# Patient Record
Sex: Female | Born: 1990 | Race: Black or African American | Hispanic: No | Marital: Single | Smoking: Never smoker
Health system: Southern US, Community
[De-identification: ages and names within clinical notes are randomized; demographics above are authoritative.]

## PROBLEM LIST (undated history)

## (undated) HISTORY — PX: CARDIAC VALVE SURGERY: SHX40

---
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
## 2013-02-18 LAB — HM PAP SMEAR: HM Pap smear: NORMAL

## 2013-02-20 ENCOUNTER — Other Ambulatory Visit: Payer: Self-pay

## 2013-02-24 ENCOUNTER — Encounter (HOSPITAL_COMMUNITY): Payer: Self-pay | Admitting: Unknown Physician Specialty

## 2013-03-17 ENCOUNTER — Ambulatory Visit (HOSPITAL_COMMUNITY)
Admission: RE | Admit: 2013-03-17 | Discharge: 2013-03-17 | Disposition: A | Discharge status: Home / Self Care | Payer: Medicaid Other | Source: Ambulatory Visit | Attending: Unknown Physician Specialty | Admitting: Unknown Physician Specialty

## 2013-03-17 ENCOUNTER — Encounter (HOSPITAL_COMMUNITY): Payer: Self-pay

## 2013-03-17 VITALS — BP 122/62 | HR 67 | Wt 151.0 lb

## 2013-03-17 DIAGNOSIS — Q210 Ventricular septal defect: Secondary | ICD-10-CM | POA: Insufficient documentation

## 2013-03-17 DIAGNOSIS — Q249 Congenital malformation of heart, unspecified: Secondary | ICD-10-CM | POA: Insufficient documentation

## 2013-03-17 DIAGNOSIS — O99891 Other specified diseases and conditions complicating pregnancy: Secondary | ICD-10-CM | POA: Insufficient documentation

## 2013-03-17 NOTE — Progress Notes (Signed)
MATERNAL FETAL MEDICINE CONSULT  Patient Name: Deborah Kaiser Medical Record Number:  [id number] Date of Birth: Jan 23, 1991 Requesting Physician Name:  Ruthy Dick, MD Date of Service: 03/17/2013  Chief Complaint Maternal history of congenital heart disease  History of Present Illness Deborah Kaiser was seen Kaiser secondary to maternal history of congenital heart disease at the request of Ruthy Dick, MD.  The patient is a 22 y.o. at 18 weeks 2 days by first trimester ultrasound with an EDC of 08/16/12.  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  She has not had any problems since.  Her last echocardiogram and cardiology visit was 3 years ago.  She is asymptomatic at this time.  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  She has had no complications of pregnancy thus far.  Review of Systems Pertinent items are noted in HPI.  Patient History OB History  No data available    Past Medical/Surgical History Congenital VSD VSD repair at 55-66 years of age  History   Social History  . Marital Status: Single    Spouse Name: N/A    Number of Children: N/A  . Years of Education: N/A   Social History Main Topics  . Smoking status: Not on file  . Smokeless tobacco: Not on file  . Alcohol Use: Not on file  . Drug Use: Not on file  . Sexual Activity: Not on file   Other Topics Concern  . Not on file   Social History Narrative  . No narrative on file    Family History In addition, the patient has no family history of mental retardation, birth defects, or genetic diseases.  Physical Examination Vitals:  BP 122/62, Pulse 67, Weight 151 lbs. General appearance - alert, well appearing, and in no distress  Assessment and Recommendations 1.  Maternal history of congenital VSD.  As Deborah Kaiser has had no problems since her repair at 21-69 years of age and the fact that  she maintains the fitness level expected of someone in [employer] reserves, I do not expect her to have any cardiac related problems in pregnancy.  However, she should have a maternal echo an Cardiology consult soon.  If her echo is normal, I expect no further Cardiology follow up will be needed.  There is a 10% risk of congenital heart disease in offspring of women with congenital heart disease themselves.  The recurrent heart lesions are not necessarily the same as those present in the mother.  Thus, Deborah Kaiser should also have a fetal echo, which has been ordered along with a detailed fetal anatomic survey at the [facility] in 4 weeks.  I spent 30 minutes with Deborah Kaiser of which 50% was face-to-face counseling.  Thank you for referring Deborah Kaiser to the [facility].  Please do not hesitate to contact us with questions.   Rema Fendt, MD

## 2013-04-14 ENCOUNTER — Other Ambulatory Visit (HOSPITAL_COMMUNITY): Payer: Self-pay | Admitting: Unknown Physician Specialty

## 2013-04-14 DIAGNOSIS — Z0489 Encounter for examination and observation for other specified reasons: Secondary | ICD-10-CM

## 2013-04-14 DIAGNOSIS — IMO0002 Reserved for concepts with insufficient information to code with codable children: Secondary | ICD-10-CM

## 2013-04-14 DIAGNOSIS — Q249 Congenital malformation of heart, unspecified: Secondary | ICD-10-CM

## 2013-04-16 ENCOUNTER — Ambulatory Visit (HOSPITAL_COMMUNITY)
Admission: RE | Admit: 2013-04-16 | Discharge: 2013-04-16 | Disposition: A | Discharge status: Home / Self Care | Payer: Medicaid Other | Source: Ambulatory Visit | Attending: Unknown Physician Specialty | Admitting: Unknown Physician Specialty

## 2013-04-16 DIAGNOSIS — IMO0002 Reserved for concepts with insufficient information to code with codable children: Secondary | ICD-10-CM

## 2013-04-16 DIAGNOSIS — O352XX0 Maternal care for (suspected) hereditary disease in fetus, not applicable or unspecified: Secondary | ICD-10-CM | POA: Insufficient documentation

## 2013-04-16 DIAGNOSIS — Q249 Congenital malformation of heart, unspecified: Secondary | ICD-10-CM

## 2013-04-16 DIAGNOSIS — O358XX0 Maternal care for other (suspected) fetal abnormality and damage, not applicable or unspecified: Secondary | ICD-10-CM | POA: Insufficient documentation

## 2013-04-16 DIAGNOSIS — Z1389 Encounter for screening for other disorder: Secondary | ICD-10-CM | POA: Insufficient documentation

## 2013-04-16 DIAGNOSIS — Z363 Encounter for antenatal screening for malformations: Secondary | ICD-10-CM | POA: Insufficient documentation

## 2013-04-16 DIAGNOSIS — Z0489 Encounter for examination and observation for other specified reasons: Secondary | ICD-10-CM

## 2013-04-23 ENCOUNTER — Other Ambulatory Visit: Payer: Self-pay

## 2013-11-02 ENCOUNTER — Encounter: Payer: Self-pay | Admitting: Women's Health

## 2013-11-02 ENCOUNTER — Ambulatory Visit (INDEPENDENT_AMBULATORY_CARE_PROVIDER_SITE_OTHER): Payer: 59 | Admitting: Women's Health

## 2013-11-02 VITALS — BP 120/62 | Ht 67.0 in | Wt 148.0 lb

## 2013-11-02 DIAGNOSIS — Z3009 Encounter for other general counseling and advice on contraception: Secondary | ICD-10-CM

## 2013-11-02 NOTE — Patient Instructions (Signed)
Levonorgestrel intrauterine device (IUD)- Mirena What is this medicine? LEVONORGESTREL IUD (LEE voe nor jes trel) is a contraceptive (birth control) device. The device is placed inside the uterus by a healthcare professional. It is used to prevent pregnancy and can also be used to treat heavy bleeding that occurs during your period. Depending on the device, it can be used for 3 to 5 years. This medicine may be used for other purposes; ask your health care provider or pharmacist if you have questions. COMMON BRAND NAME(S): Elveria RoyalsLILETTA, Mirena, Skyla What should I tell my health care provider before I take this medicine? They need to know if you have any of these conditions: -abnormal Pap smear -cancer of the breast, uterus, or cervix -diabetes -endometritis -genital or pelvic infection now or in the past -have more than one sexual partner or your partner has more than one partner -heart disease -history of an ectopic or tubal pregnancy -immune system problems -IUD in place -liver disease or tumor -problems with blood clots or take blood-thinners -use intravenous drugs -uterus of unusual shape -vaginal bleeding that has not been explained -an unusual or allergic reaction to levonorgestrel, other hormones, silicone, or polyethylene, medicines, foods, dyes, or preservatives -pregnant or trying to get pregnant -breast-feeding How should I use this medicine? This device is placed inside the uterus by a health care professional. Talk to your pediatrician regarding the use of this medicine in children. Special care may be needed. Overdosage: If you think you have taken too much of this medicine contact a poison control center or emergency room at once. NOTE: This medicine is only for you. Do not share this medicine with others. What if I miss a dose? This does not apply. What may interact with this medicine? Do not take this medicine with any of the following  medications: -amprenavir -bosentan -fosamprenavir This medicine may also interact with the following medications: -aprepitant -barbiturate medicines for inducing sleep or treating seizures -bexarotene -griseofulvin -medicines to treat seizures like carbamazepine, ethotoin, felbamate, oxcarbazepine, phenytoin, topiramate -modafinil -pioglitazone -rifabutin -rifampin -rifapentine -some medicines to treat HIV infection like atazanavir, indinavir, lopinavir, nelfinavir, tipranavir, ritonavir -St. John's wort -warfarin This list may not describe all possible interactions. Give your health care provider a list of all the medicines, herbs, non-prescription drugs, or dietary supplements you use. Also tell them if you smoke, drink alcohol, or use illegal drugs. Some items may interact with your medicine. What should I watch for while using this medicine? Visit your doctor or health care professional for regular check ups. See your doctor if you or your partner has sexual contact with others, becomes HIV positive, or gets a sexual transmitted disease. This product does not protect you against HIV infection (AIDS) or other sexually transmitted diseases. You can check the placement of the IUD yourself by reaching up to the top of your vagina with clean fingers to feel the threads. Do not pull on the threads. It is a good habit to check placement after each menstrual period. Call your doctor right away if you feel more of the IUD than just the threads or if you cannot feel the threads at all. The IUD may come out by itself. You may become pregnant if the device comes out. If you notice that the IUD has come out use a backup birth control method like condoms and call your health care provider. Using tampons will not change the position of the IUD and are okay to use during your period. What side effects  may I notice from receiving this medicine? Side effects that you should report to your doctor or  health care professional as soon as possible: -allergic reactions like skin rash, itching or hives, swelling of the face, lips, or tongue -fever, flu-like symptoms -genital sores -high blood pressure -no menstrual period for 6 weeks during use -pain, swelling, warmth in the leg -pelvic pain or tenderness -severe or sudden headache -signs of pregnancy -stomach cramping -sudden shortness of breath -trouble with balance, talking, or walking -unusual vaginal bleeding, discharge -yellowing of the eyes or skin Side effects that usually do not require medical attention (report to your doctor or health care professional if they continue or are bothersome): -acne -breast pain -change in sex drive or performance -changes in weight -cramping, dizziness, or faintness while the device is being inserted -headache -irregular menstrual bleeding within first 3 to 6 months of use -nausea This list may not describe all possible side effects. Call your doctor for medical advice about side effects. You may report side effects to FDA at 1-800-FDA-1088. Where should I keep my medicine? This does not apply. NOTE: This sheet is a summary. It may not cover all possible information. If you have questions about this medicine, talk to your doctor, pharmacist, or health care provider.  2015, Elsevier/Gold Standard. (2011-04-26 13:54:04)  

## 2013-11-02 NOTE — Progress Notes (Signed)
Patient ID: Deborah Kaiser, female   DOB: 1991/03/04, 23 y.o.   MRN: [id number]   [facility]  Patient name: Deborah Kaiser MRN [id number]  Date of birth: 1991/03/04  CC & HPI:  Deborah Kaiser is a 23 y.o. African American female presenting today wanting to discuss Mirena. She [facility] 08/21/13 [facility], [facility]. Coming to us now b/c [place]. She is bottlefeeding. Last sex was 7/10, she used a condom. LMP 7/23, still on menses. She has been on depo in the past, and doesn't like having to come in q 3 months. She had a pap Nov 2014, thinks was normal- wasn't told otherwise.    Pertinent History Reviewed:  Medical & Surgical Hx:   History reviewed. No pertinent past medical history. Past Surgical History  Procedure Laterality Date  . Cardiac valve surgery  at age 23 mo     to repair hole in heart   Medications: Reviewed & Updated - see associated section Social History: Reviewed -  reports that she has never smoked. She has never used smokeless tobacco.  Objective Findings:  Vitals: BP 120/62  Ht 5\' 7"  (1.702 m)  Wt 148 lb (67.132 kg)  BMI 23.17 kg/m2  LMP 10/29/2013  Breastfeeding? No  Physical Examination: General appearance - alert, well appearing, and in no distress  No results found for this or any previous visit (from the past 24 hour(s)).   Assessment & Plan:  A:   Contraception counseling   2 months s/p SVD P:  Abstinence until Mirena placed   F/U in the next few days for Mirena insertion  [facility]   Marge DuncansBooker, Kimberly Randall CNM, [facility] 11/02/2013 12:12 PM

## 2013-11-06 ENCOUNTER — Encounter: Payer: Self-pay | Admitting: Obstetrics and Gynecology

## 2013-11-06 ENCOUNTER — Ambulatory Visit (INDEPENDENT_AMBULATORY_CARE_PROVIDER_SITE_OTHER): Payer: 59 | Admitting: Obstetrics and Gynecology

## 2013-11-06 VITALS — BP 130/82 | Ht 68.0 in | Wt 145.2 lb

## 2013-11-06 DIAGNOSIS — Z30430 Encounter for insertion of intrauterine contraceptive device: Secondary | ICD-10-CM

## 2013-11-06 DIAGNOSIS — Z3202 Encounter for pregnancy test, result negative: Secondary | ICD-10-CM

## 2013-11-06 DIAGNOSIS — Q249 Congenital malformation of heart, unspecified: Secondary | ICD-10-CM

## 2013-11-06 LAB — POCT URINE PREGNANCY: PREG TEST UR: NEGATIVE

## 2013-11-06 NOTE — Progress Notes (Signed)
Patient identified, informed consent performed, signed copy in chart, time out was performed.  Urine pregnancy test negative.  Speculum placed in the vagina.  Cervix visualized.  Cleaned with Betadine x 2.  Grasped anteriourly with a single tooth tenaculum.  Uterus sounded to 8 cm.  Mirena IUD placed per manufacturer's recommendations.  Strings trimmed to 3 cm.   Patient given post procedure instructions and Mirena care card with expiration date.  Patient is asked to check IUD strings periodically and follow up in 4-6 weeks for IUD check.Patient identified, informed consent performed, signed copy in chart, time out was performed.  Urine pregnancy test negative.  Ultrasound post procedure confirms optimal position of IUD in antflexed uterus.

## 2013-11-17 ENCOUNTER — Encounter: Payer: Self-pay | Admitting: *Deleted

## 2013-12-04 ENCOUNTER — Ambulatory Visit (INDEPENDENT_AMBULATORY_CARE_PROVIDER_SITE_OTHER): Payer: 59 | Admitting: Adult Health

## 2013-12-04 ENCOUNTER — Encounter: Payer: Self-pay | Admitting: Adult Health

## 2013-12-04 ENCOUNTER — Ambulatory Visit: Payer: 59 | Admitting: Obstetrics and Gynecology

## 2013-12-04 ENCOUNTER — Encounter: Payer: Self-pay | Admitting: Obstetrics and Gynecology

## 2013-12-04 VITALS — BP 110/66 | Ht 67.0 in | Wt 147.0 lb

## 2013-12-04 DIAGNOSIS — Z3202 Encounter for pregnancy test, result negative: Secondary | ICD-10-CM

## 2013-12-04 DIAGNOSIS — Z3049 Encounter for surveillance of other contraceptives: Secondary | ICD-10-CM

## 2013-12-04 DIAGNOSIS — Z3042 Encounter for surveillance of injectable contraceptive: Secondary | ICD-10-CM

## 2013-12-04 LAB — POCT URINE PREGNANCY: PREG TEST UR: NEGATIVE

## 2013-12-04 MED ORDER — MEDROXYPROGESTERONE ACETATE 150 MG/ML IM SUSP
150.0000 mg | Freq: Once | INTRAMUSCULAR | Status: AC
  Administered 2013-12-04: 150 mg via INTRAMUSCULAR

## 2013-12-04 MED ORDER — MEDROXYPROGESTERONE ACETATE 150 MG/ML IM SUSP: 150.0000 mg | INTRAMUSCULAR | Status: DC

## 2013-12-04 NOTE — Progress Notes (Signed)
Patient ID: Deborah Kaiser, female   DOB: Aug 15, 1990, 23 y.o.   MRN: [id number] Pt here today for IUD check but IUD fell out two nights ago. Pt started period on Monday. Pt wants to get on another form of BC, front office is calling her insurance to see what they will cover.

## 2013-12-07 ENCOUNTER — Ambulatory Visit: Payer: 59

## 2014-02-08 ENCOUNTER — Encounter: Payer: Self-pay | Admitting: Adult Health

## 2014-02-26 ENCOUNTER — Ambulatory Visit: Payer: 59

## 2014-07-16 ENCOUNTER — Encounter: Payer: Self-pay | Admitting: *Deleted

## 2014-07-16 ENCOUNTER — Ambulatory Visit (INDEPENDENT_AMBULATORY_CARE_PROVIDER_SITE_OTHER): Payer: 59 | Admitting: *Deleted

## 2014-07-16 DIAGNOSIS — Z3042 Encounter for surveillance of injectable contraceptive: Secondary | ICD-10-CM | POA: Diagnosis not present

## 2014-07-16 DIAGNOSIS — Z3202 Encounter for pregnancy test, result negative: Secondary | ICD-10-CM | POA: Diagnosis not present

## 2014-07-16 LAB — POCT URINE PREGNANCY: PREG TEST UR: NEGATIVE

## 2014-07-16 MED ORDER — MEDROXYPROGESTERONE ACETATE 150 MG/ML IM SUSP
150.0000 mg | Freq: Once | INTRAMUSCULAR | Status: AC
  Administered 2014-07-16: 150 mg via INTRAMUSCULAR

## 2014-07-16 NOTE — Progress Notes (Signed)
Pt here for Depo shot. Started period Wednesday. Hasn't been sexually active in 1 month. Reports no problems. Return in 12 weeks for next shot. JSY

## 2014-10-08 ENCOUNTER — Ambulatory Visit: Payer: 59

## 2014-10-12 ENCOUNTER — Encounter: Payer: Self-pay | Admitting: Obstetrics & Gynecology

## 2014-10-12 ENCOUNTER — Ambulatory Visit: Payer: 59

## 2014-12-08 IMAGING — US US OB DETAIL+14 WK
1 series · 12 of 28 positions shown · non-contrast
Comparison: none

[Series 1: us ob detail+14 wk · 0.20mm/px · 12 of 92 slices shown]
[im 4/92]
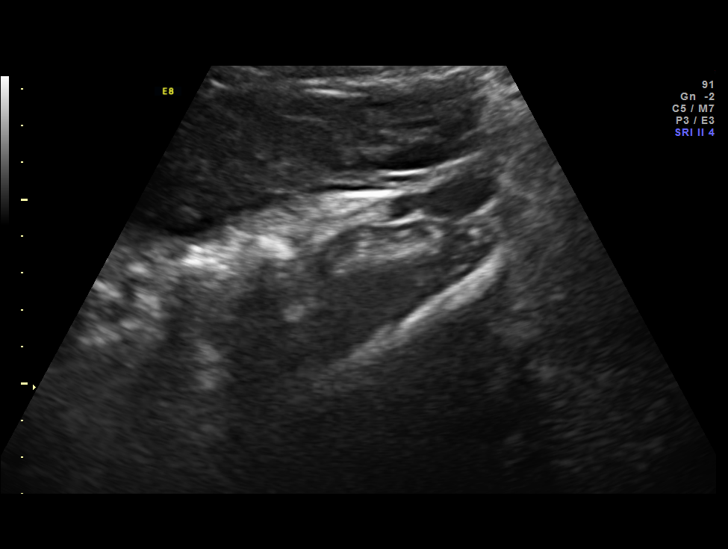
[im 11/92]
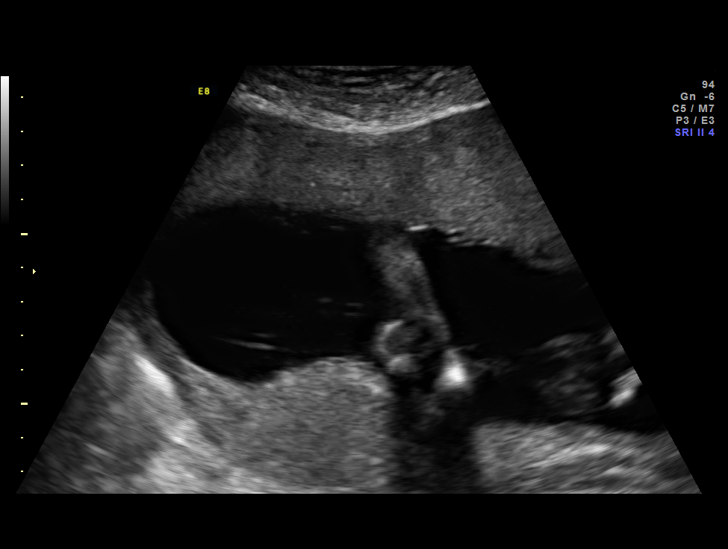
[im 17/92]
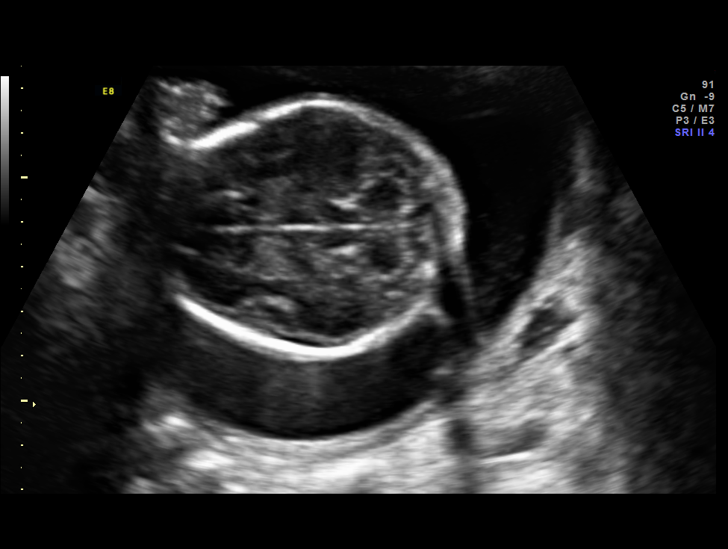
[im 27/92]
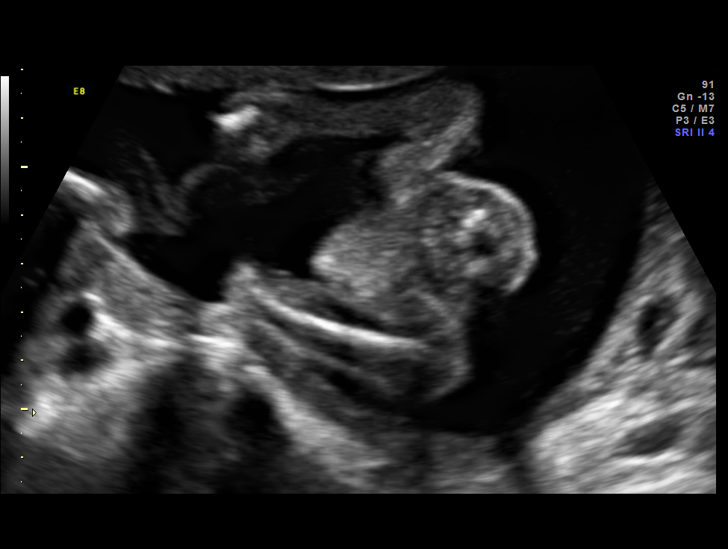
[im 34/92]
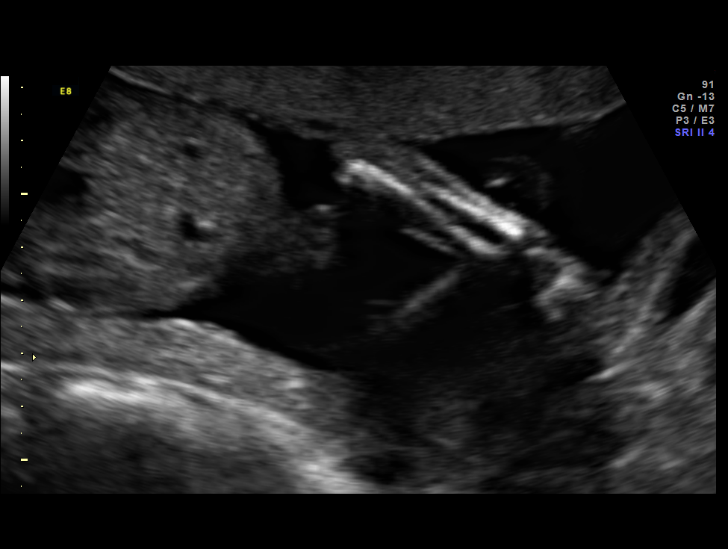
[im 41/92]
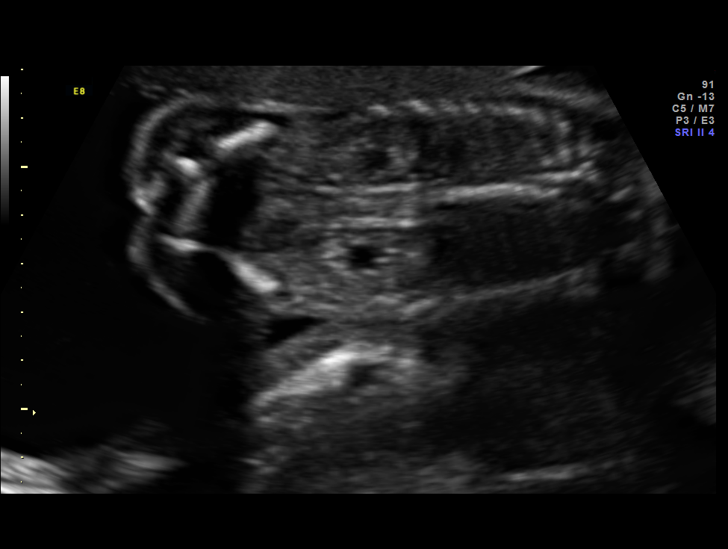
[im 51/92]
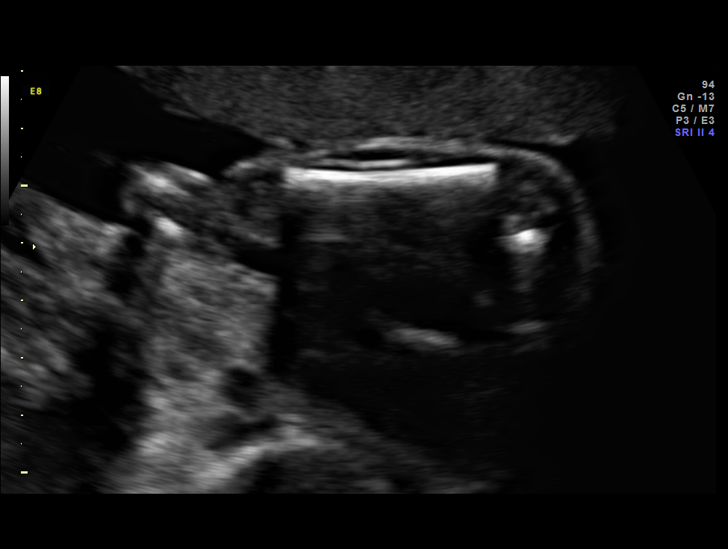
[im 58/92]
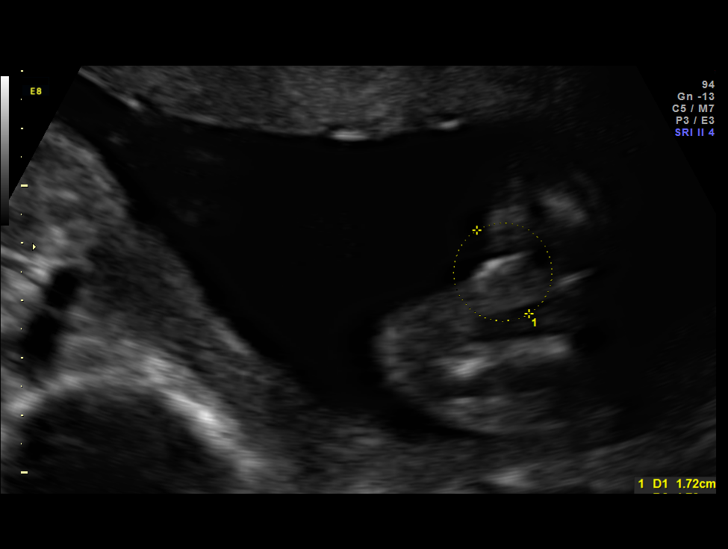
[im 65/92]
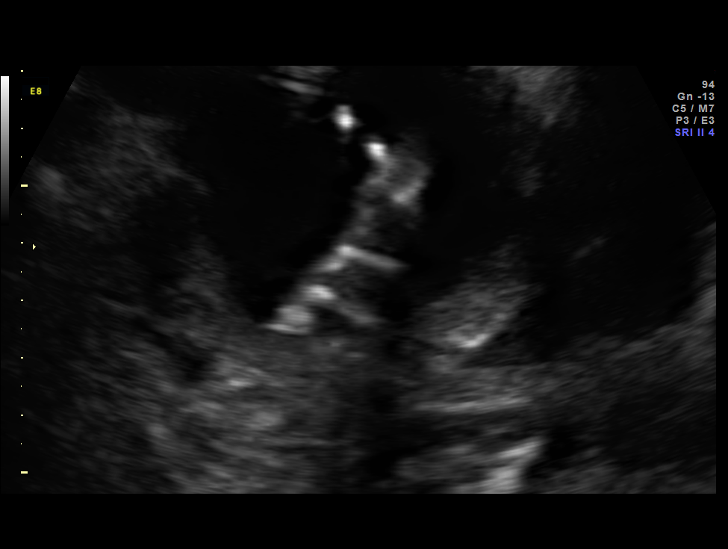
[im 75/92]
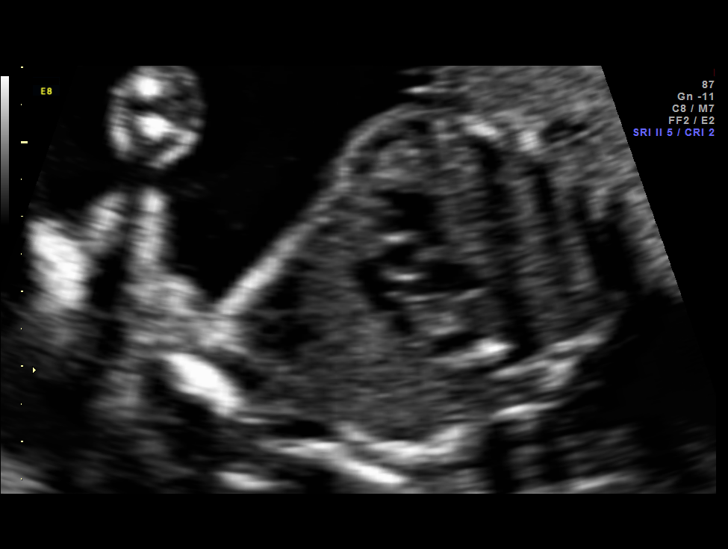
[im 81/92]
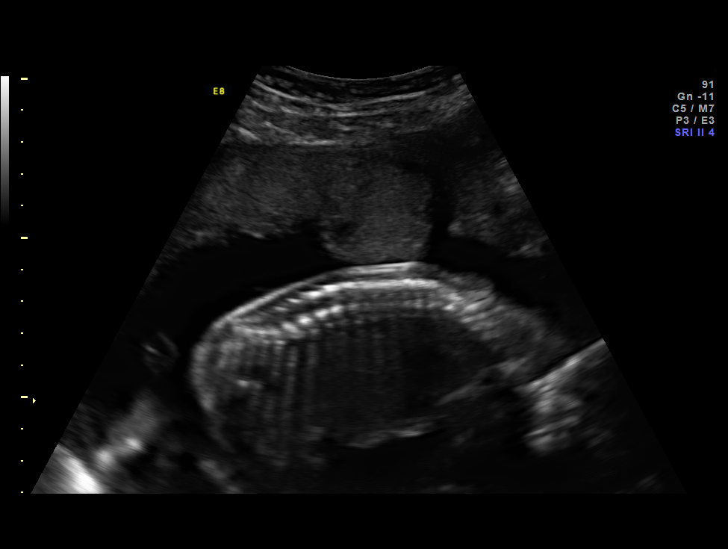
[im 88/92]
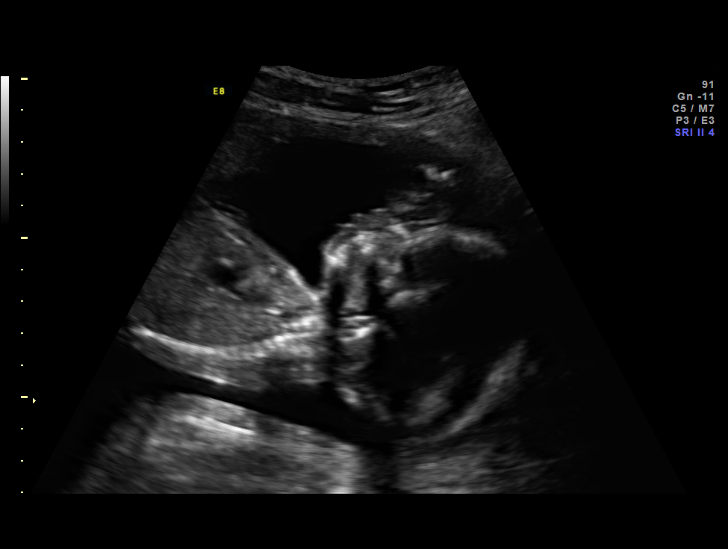

[12 of 28 positions shown; findings below may reference images not displayed]

OBSTETRICS REPORT
                      (Signed Final 04/16/2013 [DATE])

Service(s) Provided

 US OB DETAIL + 14 WK                                  76811.0
Indications

 Detailed fetal anatomic survey
 History of genetic / anatomic abnormality - pt had
 VSD
Fetal Evaluation

 Num Of Fetuses:    1
 Fetal Heart Rate:  173                          bpm
 Cardiac Activity:  Observed
 Presentation:      Cephalic
 Placenta:          Anterior, above cervical os
 P. Cord            Visualized
 Insertion:

 Amniotic Fluid
 AFI FV:      Subjectively within normal limits
                                             Larg Pckt:     6.2  cm
Biometry

 BPD:     55.8  mm     G. Age:  23w 0d                CI:         77.5   70 - 86
 OFD:       72  mm                                    FL/HC:      18.4   19.2 -

 HC:     205.5  mm     G. Age:  22w 5d       41  %    HC/AC:      1.23   1.05 -

 AC:     167.7  mm     G. Age:  21w 5d       20  %    FL/BPD:     67.9   71 - 87
 FL:      37.9  mm     G. Age:  22w 1d       25  %    FL/AC:      22.6   20 - 24
 HUM:       38  mm     G. Age:  23w 3d       65  %
 CER:     24.8  mm     G. Age:  22w 6d       53  %

 Est. FW:     471  gm      1 lb 1 oz     37  %
Gestational Age

 LMP:           22w 4d        Date:  11/09/12                 EDD:   08/16/13
 U/S Today:     22w 3d                                        EDD:   08/17/13
 Best:          22w 4d     Det. By:  LMP  (11/09/12)          EDD:   08/16/13
Anatomy
 Cranium:          Appears normal         Aortic Arch:      Appears normal
 Fetal Cavum:      Appears normal         Ductal Arch:      Appears normal
 Ventricles:       Appears normal         Diaphragm:        Appears normal
 Choroid Plexus:   Appears normal         Stomach:          Appears normal, left
                                                            sided
 Cerebellum:       Appears normal         Abdomen:          Appears normal
 Posterior Fossa:  Appears normal         Abdominal Wall:   Appears nml (cord
                                                            insert, abd wall)
 Nuchal Fold:      Not applicable (>20    Cord Vessels:     Appears normal (3
                   wks GA)                                  vessel cord)
 Face:             Appears normal         Kidneys:          Appear normal
                   (orbits and profile)
 Lips:             Appears normal         Bladder:          Appears normal
 Palate:           Appears normal         Spine:            Appears normal
 Heart:            Appears normal         Lower             Appears normal
                   (4CH, axis, and        Extremities:
                   situs)
 RVOT:             Appears normal         Upper             Appears normal
                                          Extremities:
 LVOT:             Appears normal

 Other:  Heels and 5th digit previously seen. Fetus appears to be a female.
Cervix Uterus Adnexa

 Cervical Length:    3.3      cm

 Cervix:       Normal appearance by transabdominal scan. Appears
               closed, without funnelling.

 Left Ovary:    Not visualized. No adnexal mass visualized.
 Right Ovary:   Not visualized. No adnexal mass visualized.
Comments

 The patient's fetal anatomic survey is now complete.  No fetal
 anomalies or soft markers of aneuploidy were seen.  Results
 of scan discussed with patient, as well as the limitations of
 U/S to detect all fetal anomalies and aneuploidies.  No further
 ultrasounds are required unless additional problems arise.
Impression

 Single living intrauterine pregnancy at 22 weeks 4 days.
 Appropriate fetal growth (37%).
 Normal amniotic fluid volume.
 Normal fetal anatomy.
 No fetal anomalies or soft markers of aneuploidy seen.
Recommendations

 Follow-up ultrasounds as clinically indicated.

                Biink Benk, [place]

## 2014-12-28 ENCOUNTER — Other Ambulatory Visit: Payer: Self-pay | Admitting: Obstetrics and Gynecology

## 2014-12-28 ENCOUNTER — Other Ambulatory Visit: Payer: Federal, State, Local not specified - PPO

## 2014-12-28 ENCOUNTER — Telehealth: Payer: Self-pay | Admitting: Obstetrics & Gynecology

## 2014-12-28 ENCOUNTER — Ambulatory Visit: Payer: 59

## 2014-12-28 ENCOUNTER — Other Ambulatory Visit: Payer: 59

## 2014-12-28 DIAGNOSIS — Z3042 Encounter for surveillance of injectable contraceptive: Secondary | ICD-10-CM

## 2014-12-28 MED ORDER — MEDROXYPROGESTERONE ACETATE 150 MG/ML IM SUSP: 150.0000 mg | INTRAMUSCULAR | Status: DC

## 2014-12-29 ENCOUNTER — Encounter: Payer: Self-pay | Admitting: Advanced Practice Midwife

## 2014-12-29 ENCOUNTER — Ambulatory Visit (INDEPENDENT_AMBULATORY_CARE_PROVIDER_SITE_OTHER): Payer: Federal, State, Local not specified - PPO | Admitting: Advanced Practice Midwife

## 2014-12-29 VITALS — BP 120/74 | Ht 67.0 in

## 2014-12-29 DIAGNOSIS — N939 Abnormal uterine and vaginal bleeding, unspecified: Secondary | ICD-10-CM

## 2014-12-29 DIAGNOSIS — Z3009 Encounter for other general counseling and advice on contraception: Secondary | ICD-10-CM | POA: Diagnosis not present

## 2014-12-29 DIAGNOSIS — O3680X1 Pregnancy with inconclusive fetal viability, fetus 1: Secondary | ICD-10-CM

## 2014-12-29 DIAGNOSIS — O3680X0 Pregnancy with inconclusive fetal viability, not applicable or unspecified: Secondary | ICD-10-CM | POA: Insufficient documentation

## 2014-12-29 DIAGNOSIS — Z3490 Encounter for supervision of normal pregnancy, unspecified, unspecified trimester: Secondary | ICD-10-CM

## 2014-12-29 LAB — BETA HCG QUANT (REF LAB): hCG Quant: 515 m[IU]/mL

## 2014-12-29 NOTE — Addendum Note (Signed)
Addended by: Criss Alvine on: 12/29/2014 09:18 AM   Modules accepted: Orders

## 2014-12-29 NOTE — Progress Notes (Addendum)
Patient ID: Deborah Kaiser, female   DOB: 07-Mar-1991, 24 y.o.   MRN: [id number] Pt here today for DEPO injection but the pt has had a Positive QHCG. Pt was informed of this and stated that she had really heavy bleeding the other day and is still having some bleeding now. I advised the pt that would need to get her worked in to see a provider to follow up with the problem.   Pt states that her periods have been crazy since delivering her daughter and starting DEPO. Pt states that her DEPO was due around the end of June.

## 2014-12-29 NOTE — Addendum Note (Signed)
Addended by: Jacklyn Shell on: 12/29/2014 09:37 AM   Modules accepted: Orders, Level of Service

## 2014-12-29 NOTE — Progress Notes (Addendum)
[facility] Visit  Patient name: Deborah Kaiser MRN [id number]  Date of birth: 07/12/1990  CC & HPI:  Deborah Kaiser is a 24 y.o. African American female presenting today for depo.  Her last shot was 6 months ago.  Her qhcg yesterday was 515.  She states that she had "heavy bleeding like a period" Started Saturday tapering of now. No pain, just some mild cramping on Saturday. No cramping now.   Pertinent History Reviewed:  Medical & Surgical Hx:   History reviewed. No pertinent past medical history. Past Surgical History  Procedure Laterality Date  . Cardiac valve surgery  at age 17 mo     to repair hole in heart   Family History  Problem Relation Age of Onset  . Cancer Maternal Grandfather     colon    Current outpatient prescriptions:  .  medroxyPROGESTERone (DEPO-PROVERA) 150 MG/ML injection, Inject 1 mL (150 mg total) into the muscle every 3 (three) months. Bring to office and have injected., Disp: 1 mL, Rfl: 4 .  medroxyPROGESTERone (DEPO-PROVERA) 150 MG/ML injection, Inject 1 mL (150 mg total) into the muscle every 3 (three) months., Disp: 1 mL, Rfl: 3 Social History: Reviewed -  reports that she has never smoked. She has never used smokeless tobacco.  Review of Systems:   Constitutional: Negative for fever and chills Eyes: Negative for visual disturbances Respiratory: Negative for shortness of breath, dyspnea Cardiovascular: Negative for chest pain or palpitations  Gastrointestinal: Negative for vomiting, diarrhea and constipation; no abdominal pain Genitourinary: Negative for dysuria and urgency, vaginal irritation or itching Musculoskeletal: Negative for back pain, joint pain, myalgias  Neurological: Negative for dizziness and headaches    Objective Findings:  Vitals: BP 120/74 mmHg  Ht  (1.702 m)  LMP   Breastfeeding? No  Physical Examination: General appearance - alert, well appearing, and in no distress Mental status - alert, oriented to  person, place, and time Abdomen - Soft, nontender Musculoskeletal - full range of motion without pain Chest:  Normal respiratory effort  Results for orders placed or performed in visit on 12/28/14 (from the past 24 hour(s))  Beta HCG, Quant (tumor marker)   Collection Time: 12/28/14 11:13 AM  Result Value Ref Range   hCG Quant 515 mIU/mL         Assessment & Plan:  A:   Bleeding in early pregnancy P:  ABO, repeat Qhcg tomorrow  Ectopic precautions   CRESENZO-DISHMAN,Jaaziel Peatross CNM 12/29/2014 9:07 AM      12/30/2014 Hcg down to 115. DX;;SAB.  No bleeding.  "95% sure" she is A+ and "100% positive" that she did not get rhogam shots with pregnancy. (ABO/Rh not run and can't add to blood:--pt already back in [place]).  Plans to come back Monday for Depo.  Will confirm ABO/Rh and repeat Qhcg at that time.

## 2014-12-29 NOTE — Patient Instructions (Signed)
°Ectopic Pregnancy °An ectopic pregnancy is when the fertilized egg attaches (implants) outside the uterus. Most ectopic pregnancies occur in the fallopian tube. Rarely do ectopic pregnancies occur on the ovary, intestine, pelvis, or cervix. In an ectopic pregnancy, the fertilized egg does not have the ability to develop into a normal, healthy baby.  °A ruptured ectopic pregnancy is one in which the fallopian tube gets torn or bursts and results in internal bleeding. Often there is intense abdominal pain, and sometimes, vaginal bleeding. Having an ectopic pregnancy can be life threatening. If left untreated, this dangerous condition can lead to a blood transfusion, abdominal surgery, or even death. °CAUSES  °Damage to the fallopian tubes is the suspected cause in most ectopic pregnancies.  °RISK FACTORS °Depending on your circumstances, the risk of having an ectopic pregnancy will vary. The level of risk can be divided into three categories. °High Risk °· You have gone through infertility treatment. °· You have had a previous ectopic pregnancy. °· You have had previous tubal surgery. °· You have had previous surgery to have the fallopian tubes tied (tubal ligation). °· You have tubal problems or diseases. °· You have been exposed to DES. DES is a medicine that was used until 1971 and had effects on babies whose mothers took the medicine. °· You become pregnant while using an intrauterine device (IUD) for birth control.  °Moderate Risk °· You have a history of infertility. °· You have a history of a sexually transmitted infection (STI). °· You have a history of pelvic inflammatory disease (PID). °· You have scarring from endometriosis. °· You have multiple sexual partners. °· You smoke.  °Low Risk °· You have had previous pelvic surgery. °· You use vaginal douching. °· You became sexually active before 24 years of age. °SIGNS AND SYMPTOMS  °An ectopic pregnancy should be suspected in anyone who has missed a period  and has abdominal pain or bleeding. °· You may experience normal pregnancy symptoms, such as: °¨ Nausea. °¨ Tiredness. °¨ Breast tenderness. °· Other symptoms may include: °¨ Pain with intercourse. °¨ Irregular vaginal bleeding or spotting. °¨ Cramping or pain on one side or in the lower abdomen. °¨ Fast heartbeat. °¨ Passing out while having a bowel movement. °· Symptoms of a ruptured ectopic pregnancy and internal bleeding may include: °¨ Sudden, severe pain in the abdomen and pelvis. °¨ Dizziness or fainting. °¨ Pain in the shoulder area. °DIAGNOSIS  °Tests that may be performed include: °· A pregnancy test. °· An ultrasound test. °· Testing the specific level of pregnancy hormone in the bloodstream. °· Taking a sample of uterus tissue (dilation and curettage, D&C). °· Surgery to perform a visual exam of the inside of the abdomen using a thin, lighted tube with a tiny camera on the end (laparoscope). °TREATMENT  °An injection of a medicine called methotrexate may be given. This medicine causes the pregnancy tissue to be absorbed. It is given if: °· The diagnosis is made early. °· The fallopian tube has not ruptured. °· You are considered to be a good candidate for the medicine. °Usually, pregnancy hormone blood levels are checked after methotrexate treatment. This is to be sure the medicine is effective. It may take 4-6 weeks for the pregnancy to be absorbed (though most pregnancies will be absorbed by 3 weeks). °Surgical treatment may be needed. A laparoscope may be used to remove the pregnancy tissue. If severe internal bleeding occurs, a cut (incision) may be made in the lower abdomen (laparotomy), and the ectopic   pregnancy is removed. This stops the bleeding. Part of the fallopian tube, or the whole tube, may be removed as well (salpingectomy). After surgery, pregnancy hormone tests may be done to be sure there is no pregnancy tissue left. You may receive a Rho (D) immune globulin shot if you are Rh negative  and the father is Rh positive, or if you do not know the Rh type of the father. This is to prevent problems with any future pregnancy. °SEEK IMMEDIATE MEDICAL CARE IF:  °You have any symptoms of an ectopic pregnancy. This is a medical emergency. °MAKE SURE YOU: °· Understand these instructions. °· Will watch your condition. °· Will get help right away if you are not doing well or get worse. °Document Released: 05/03/2004 Document Revised: 08/10/2013 Document Reviewed: 10/23/2012 °[employer]® Patient Information ©2015 [employer]. This information is not intended to replace advice given to you by your health care provider. Make sure you discuss any questions you have with your health care provider. ° ° °

## 2014-12-30 ENCOUNTER — Other Ambulatory Visit: Payer: Self-pay | Admitting: Advanced Practice Midwife

## 2014-12-30 LAB — BETA HCG QUANT (REF LAB): hCG Quant: 195 m[IU]/mL

## 2014-12-30 NOTE — Addendum Note (Signed)
Addended by: Jacklyn Shell on: 12/30/2014 02:55 PM   Modules accepted: Orders

## 2015-01-03 ENCOUNTER — Ambulatory Visit (INDEPENDENT_AMBULATORY_CARE_PROVIDER_SITE_OTHER): Payer: Federal, State, Local not specified - PPO | Admitting: *Deleted

## 2015-01-03 ENCOUNTER — Encounter: Payer: Self-pay | Admitting: *Deleted

## 2015-01-03 DIAGNOSIS — Z3202 Encounter for pregnancy test, result negative: Secondary | ICD-10-CM | POA: Diagnosis not present

## 2015-01-03 DIAGNOSIS — Z3042 Encounter for surveillance of injectable contraceptive: Secondary | ICD-10-CM

## 2015-01-03 LAB — POCT URINE PREGNANCY: PREG TEST UR: POSITIVE — AB

## 2015-01-03 MED ORDER — MEDROXYPROGESTERONE ACETATE 150 MG/ML IM SUSP
150.0000 mg | Freq: Once | INTRAMUSCULAR | Status: AC
  Administered 2015-01-03: 150 mg via INTRAMUSCULAR

## 2015-01-03 NOTE — Progress Notes (Signed)
Pt here for Depo. UPT was positive. Pt saw Despina Hick on Tuesday. Had quant Tuesday and Thursday. Sharene Butters is dropping. Recent miscarriage. Drenda Freeze advised it was ok to get Depo today but wants ABO/RH done and quant repeated. Pt to have labs drawn when she leaves office. Pt voiced understanding. Return in 12 weeks for next shot. JSY

## 2015-03-28 ENCOUNTER — Ambulatory Visit: Payer: Federal, State, Local not specified - PPO

## 2015-12-01 ENCOUNTER — Other Ambulatory Visit: Payer: Self-pay | Admitting: Adult Health

## 2016-02-24 ENCOUNTER — Other Ambulatory Visit: Payer: Self-pay | Admitting: Adult Health
